# Patient Record
Sex: Male | Born: 1990 | Race: White | Hispanic: No | Marital: Single | Smoking: Current some day smoker
Health system: Southern US, Community
[De-identification: ages and names within clinical notes are randomized; demographics above are authoritative.]

---
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
## 2012-03-26 ENCOUNTER — Ambulatory Visit: Payer: Self-pay | Admitting: Family Medicine

## 2015-01-26 ENCOUNTER — Encounter (HOSPITAL_COMMUNITY): Payer: Self-pay | Admitting: Emergency Medicine

## 2015-01-26 DIAGNOSIS — W228XXA Striking against or struck by other objects, initial encounter: Secondary | ICD-10-CM | POA: Insufficient documentation

## 2015-01-26 DIAGNOSIS — Y990 Civilian activity done for income or pay: Secondary | ICD-10-CM | POA: Insufficient documentation

## 2015-01-26 DIAGNOSIS — Y9389 Activity, other specified: Secondary | ICD-10-CM | POA: Insufficient documentation

## 2015-01-26 DIAGNOSIS — S0501XA Injury of conjunctiva and corneal abrasion without foreign body, right eye, initial encounter: Secondary | ICD-10-CM | POA: Insufficient documentation

## 2015-01-26 DIAGNOSIS — Z720 Tobacco use: Secondary | ICD-10-CM | POA: Insufficient documentation

## 2015-01-26 DIAGNOSIS — Y9289 Other specified places as the place of occurrence of the external cause: Secondary | ICD-10-CM | POA: Insufficient documentation

## 2015-01-26 NOTE — ED Notes (Signed)
Pt. reported that a piece of metal hit his right eye at work last Tuesday , denies blurred vision , presents with redness/pain and teary right eye.

## 2015-01-27 ENCOUNTER — Emergency Department (HOSPITAL_COMMUNITY)
Admission: EM | Admit: 2015-01-27 | Discharge: 2015-01-27 | Disposition: A | Discharge status: Home / Self Care | Payer: Self-pay | Attending: Emergency Medicine | Admitting: Emergency Medicine

## 2015-01-27 DIAGNOSIS — S0501XA Injury of conjunctiva and corneal abrasion without foreign body, right eye, initial encounter: Secondary | ICD-10-CM

## 2015-01-27 MED ORDER — FLUORESCEIN SODIUM 1 MG OP STRP
1.0000 | ORAL_STRIP | Freq: Once | OPHTHALMIC | Status: AC
  Administered 2015-01-27: 1 via OPHTHALMIC
  Filled 2015-01-27: qty 1

## 2015-01-27 MED ORDER — TOBRAMYCIN 0.3 % OP OINT
TOPICAL_OINTMENT | Freq: Four times a day (QID) | OPHTHALMIC | Status: DC
  Filled 2015-01-27: qty 7

## 2015-01-27 MED ORDER — TETRACAINE HCL 0.5 % OP SOLN
2.0000 [drp] | Freq: Once | OPHTHALMIC | Status: AC
  Administered 2015-01-27: 2 [drp] via OPHTHALMIC
  Filled 2015-01-27: qty 2

## 2015-01-27 NOTE — ED Provider Notes (Signed)
CSN: [id number]     Arrival date & time 01/26/15  2312 History   First MD Initiated Contact with Patient 01/27/15 0056     Chief Complaint  Patient presents with  . Foreign Body in Eye     (Consider location/radiation/quality/duration/timing/severity/associated sxs/prior Treatment) Patient is a 24 y.o. male presenting with foreign body in eye. The history is provided by the patient. No language interpreter was used.  Foreign Body in Eye This is a new problem. The current episode started today. Pertinent negatives include no fever. Associated symptoms comments: He was sanding a steel piece while at work today, wearing safety glasses, and felt something enter his right eye. He has had pain, redness, increased tearing since, and feels a sense of a foreign body still in the eye.Marland Kitchen.    History reviewed. No pertinent past medical history. History reviewed. No pertinent past surgical history. No family history on file. History  Substance Use Topics  . Smoking status: Current Some Day Smoker  . Smokeless tobacco: Not on file  . Alcohol Use: Yes    Review of Systems  Constitutional: Negative for fever.  HENT: Negative for facial swelling.   Eyes: Positive for pain and redness. Negative for visual disturbance.      Allergies  Review of patient's allergies indicates no known allergies.  Home Medications   Prior to Admission medications   Not on File   BP 133/82 mmHg  Pulse 73  Temp(Src) 98.6 F (37 C) (Oral)  Resp 14  Ht 6' (1.829 m)  Wt 238 lb (107.956 kg)  BMI 32.27 kg/m2  SpO2 98% Physical Exam  Constitutional: He is oriented to person, place, and time. He appears well-developed and well-nourished.  Eyes:  Right eye injected, with excessive clear tearing. No chemosis. No hyphema. No foreign body observed. Fluorescein uptake on cornea consistent with abrasion.  Neck: Normal range of motion.  Pulmonary/Chest: Effort normal.  Musculoskeletal: Normal range of motion.   Neurological: He is alert and oriented to person, place, and time.  Skin: Skin is warm and dry.  Psychiatric: He has a normal mood and affect.    ED Course  Procedures (including critical care time) Labs Review Labs Reviewed - No data to display  Imaging Review No results found.   EKG Interpretation None      MDM   Final diagnoses:  None    1. Corneal abrasion, right  Tobrex ointment provided. Refer to Dr. Dione BoozeGroat if pain persists.    Elpidio AnisShari Lucelia Lacey, PA-C 01/27/15 0550  Loren Raceravid Yelverton, MD 01/28/15 [phone]

## 2015-01-27 NOTE — Discharge Instructions (Signed)
Corneal Abrasion °The cornea is the clear covering at the front and center of the eye. When looking at the colored portion of the eye (iris), you are looking through the cornea. This very thin tissue is made up of many layers. The surface layer is a single layer of cells (corneal epithelium) and is one of the most sensitive tissues in the body. If a scratch or injury causes the corneal epithelium to come off, it is called a corneal abrasion. If the injury extends to the tissues below the epithelium, the condition is called a corneal ulcer. °CAUSES  °· Scratches. °· Trauma. °· Foreign body in the eye. °Some people have recurrences of abrasions in the area of the original injury even after it has healed (recurrent erosion syndrome). Recurrent erosion syndrome generally improves and goes away with time. °SYMPTOMS  °· Eye pain. °· Difficulty or inability to keep the injured eye open. °· The eye becomes very sensitive to light. °· Recurrent erosions tend to happen suddenly, first thing in the morning, usually after waking up and opening the eye. °DIAGNOSIS  °Your health care provider can diagnose a corneal abrasion during an eye exam. Dye is usually placed in the eye using a drop or a small paper strip moistened by your tears. When the eye is examined with a special light, the abrasion shows up clearly because of the dye. °TREATMENT  °· Small abrasions may be treated with antibiotic drops or ointment alone. °· A pressure patch may be put over the eye. If this is done, follow your doctor's instructions for when to remove the patch. Do not drive or use machines while the eye patch is on. Judging distances is hard to do with a patch on. °If the abrasion becomes infected and spreads to the deeper tissues of the cornea, a corneal ulcer can result. This is serious because it can cause corneal scarring. Corneal scars interfere with light passing through the cornea and cause a loss of vision in the involved eye. °HOME CARE  INSTRUCTIONS °· Use medicine or ointment as directed. Only take over-the-counter or prescription medicines for pain, discomfort, or fever as directed by your health care provider. °· Do not drive or operate machinery if your eye is patched. Your ability to judge distances is impaired. °· If your health care provider has given you a follow-up appointment, it is very important to keep that appointment. Not keeping the appointment could result in a severe eye infection or permanent loss of vision. If there is any problem keeping the appointment, let your health care provider know. °SEEK MEDICAL CARE IF:  °· You have pain, light sensitivity, and a scratchy feeling in one eye or both eyes. °· Your pressure patch keeps loosening up, and you can blink your eye under the patch after treatment. °· Any kind of discharge develops from the eye after treatment or if the lids stick together in the morning. °· You have the same symptoms in the morning as you did with the original abrasion days, weeks, or months after the abrasion healed. °MAKE SURE YOU:  °· Understand these instructions. °· Will watch your condition. °· Will get help right away if you are not doing well or get worse. °Document Released: 08/16/2000 Document Revised: 08/24/2013 Document Reviewed: 04/26/2013 °[employer]® Patient Information ©2015 [employer]. This information is not intended to replace advice given to you by your health care provider. Make sure you discuss any questions you have with your health care provider. ° °

## 2015-01-27 NOTE — ED Notes (Signed)
Pt stable, ambulatory, states understanding of discharge instructions 

## 2016-08-29 ENCOUNTER — Encounter (HOSPITAL_COMMUNITY): Payer: Self-pay | Admitting: Emergency Medicine

## 2016-08-29 ENCOUNTER — Emergency Department (HOSPITAL_COMMUNITY)
Admission: EM | Admit: 2016-08-29 | Discharge: 2016-08-29 | Disposition: A | Discharge status: Home / Self Care | Payer: Self-pay | Attending: Emergency Medicine | Admitting: Emergency Medicine

## 2016-08-29 DIAGNOSIS — F17200 Nicotine dependence, unspecified, uncomplicated: Secondary | ICD-10-CM | POA: Insufficient documentation

## 2016-08-29 DIAGNOSIS — Y9389 Activity, other specified: Secondary | ICD-10-CM | POA: Insufficient documentation

## 2016-08-29 DIAGNOSIS — Y999 Unspecified external cause status: Secondary | ICD-10-CM | POA: Insufficient documentation

## 2016-08-29 DIAGNOSIS — Y929 Unspecified place or not applicable: Secondary | ICD-10-CM | POA: Insufficient documentation

## 2016-08-29 DIAGNOSIS — S0181XA Laceration without foreign body of other part of head, initial encounter: Secondary | ICD-10-CM | POA: Insufficient documentation

## 2016-08-29 DIAGNOSIS — W208XXA Other cause of strike by thrown, projected or falling object, initial encounter: Secondary | ICD-10-CM | POA: Insufficient documentation

## 2016-08-29 MED ORDER — LIDOCAINE HCL (PF) 1 % IJ SOLN
5.0000 mL | Freq: Once | INTRAMUSCULAR | Status: AC
  Administered 2016-08-29: 5 mL
  Filled 2016-08-29: qty 5

## 2016-08-29 NOTE — Discharge Instructions (Signed)
Suture removal in 7 days

## 2016-08-29 NOTE — ED Notes (Addendum)
ED Provider at bedside. 

## 2016-08-29 NOTE — ED Triage Notes (Signed)
Patient presents with skin laceration approx. 1/2 inch at forehead sustained this afternoon from a wood frame .

## 2016-08-29 NOTE — ED Provider Notes (Signed)
[facility]-EMERGENCY DEPT Provider Note   CSN: [id number] Arrival date & time: 08/29/16  1842  By signing my name below, I, Jared Sanders, attest that this documentation has been prepared under the direction and in the presence of Jared Sanders Jolynda Townley, NP-C. Electronically Signed: Orpah CobbMaurice Sanders , ED Scribe. 08/29/16. 8:13 PM.   History   Chief Complaint Chief Complaint  Patient presents with  . Laceration    HPI HPI Comments: Jared Sanders is a 25 y.o. male who presents to the Emergency Department complaining of a laceration with sudden onset x5 hours.Pt states that while putting a wood frame up, he looked away for a moment and the wood frame fell hitting him in the forehead. He reports with a laceration to the forehead. He states that he saw flashes after the impact but denies LOC, blurred vision, headache, nausea, vomiting. Pt's tetanus is UTD.   The history is provided by the patient. No language interpreter was used.  Laceration   The incident occurred 3 to 5 hours ago. Pain location: forehead. Size: 1.6cm. Injury mechanism: a wood frame. The pain is mild. The pain has been improving since onset. He reports no foreign bodies present. His tetanus status is UTD.    History reviewed. No pertinent past medical history.  There are no active problems to display for this patient.   History reviewed. No pertinent surgical history.     Home Medications    Prior to Admission medications   Not on File    Family History No family history on file.  Social History Social History  Substance Use Topics  . Smoking status: Current Some Day Smoker  . Smokeless tobacco: Never Used  . Alcohol use Yes     Allergies   Patient has no known allergies.   Review of Systems Review of Systems  Constitutional: Negative for chills and fever.  HENT: Negative for ear pain and sore throat.   Eyes: Negative for pain and visual disturbance.  Respiratory: Negative for cough and shortness of  breath.   Cardiovascular: Negative for chest pain and palpitations.  Gastrointestinal: Negative for abdominal pain and vomiting.  Genitourinary: Negative for dysuria and hematuria.  Musculoskeletal: Negative for arthralgias and back pain.  Skin: Negative for color change and rash.  Neurological: Negative for seizures and syncope.  All other systems reviewed and are negative.    Physical Exam Updated Vital Signs BP 135/96 (BP Location: Left Arm)   Pulse 86   Temp 98.7 F (37.1 C) (Oral)   Resp 16   Ht 6\' 1"  (1.854 m)   Wt 239 lb (108.4 kg)   SpO2 96%   BMI 31.53 kg/m   Physical Exam  Constitutional: He appears well-developed and well-nourished.  HENT:  Head: Normocephalic and atraumatic.  Eyes: Conjunctivae are normal.  Neck: Neck supple.  Cardiovascular: Normal rate and regular rhythm.   No murmur heard. Pulmonary/Chest: Effort normal and breath sounds normal. No respiratory distress.  Abdominal: Soft. There is no tenderness.  Musculoskeletal: He exhibits no edema.  Neurological: He is alert.  Skin: Skin is warm and dry. Laceration noted.     Psychiatric: He has a normal mood and affect.  Nursing note and vitals reviewed.    ED Treatments / Results   DIAGNOSTIC STUDIES: Oxygen Saturation is 96% on Ra, adequate by my interpretation.   COORDINATION OF CARE: 8:13 PM-Discussed next steps with pt. Pt verbalized understanding and is agreeable with the plan.    Labs (all labs ordered are listed, but  only abnormal results are displayed) Labs Reviewed - No data to display  EKG  EKG Interpretation None       Radiology No results found.  Procedures .Marland Kitchen.Laceration Repair Date/Time: 08/29/2016 9:14 PM Performed by: Katrinka BlazingSMITH, Emerick Weatherly Authorized by: Katrinka BlazingSMITH, Merida Alcantar   Consent:    Consent obtained:  Verbal   Consent given by:  Patient   Risks discussed:  Infection and poor cosmetic result   Alternatives discussed:  No treatment Anesthesia (see MAR for exact  dosages):    Anesthesia method:  Local infiltration (1% lidocaine)   Local anesthetic:  Lidocaine 1% w/o epi Laceration details:    Location:  Scalp (forehead)   Length (cm):  1.6 Repair type:    Repair type:  Simple Pre-procedure details:    Preparation:  Patient was prepped and draped in usual sterile fashion Exploration:    Contaminated: no   Treatment:    Area cleansed with:  Betadine and saline   Amount of cleaning:  Standard   Irrigation solution:  Sterile saline Skin repair:    Repair method:  Sutures   Suture size:  4-0   Suture material:  Prolene Approximation:    Approximation:  Close Post-procedure details:    Dressing:  Antibiotic ointment and non-adherent dressing   Patient tolerance of procedure:  Tolerated well, no immediate complications    (including critical care time)  Medications Ordered in ED Medications - No data to display   Initial Impression / Assessment and Plan / ED Course  I have reviewed the triage vital signs and the nursing notes.  Pertinent labs & imaging results that were available during my care of the patient were reviewed by me and considered in my medical decision making (see chart for details).  Clinical Course     Tetanus UTD. Laceration occurred < 12 hours prior to repair. Discussed laceration care with pt and answered questions. Pt to f-u for suture removal in 7-10 days and wound check sooner should there be signs of dehiscence or infection. Pt is hemodynamically stable with no complaints prior to dc.     Final Clinical Impressions(s) / ED Diagnoses   Final diagnoses:  Facial laceration, initial encounter    New Prescriptions There are no discharge medications for this patient.  I personally performed the services described in this documentation, which was scribed in my presence. The recorded information has been reviewed and is accurate.     Jared Sanders Jared Leasure, NP 08/30/16 [phone]    Gwyneth SproutWhitney Plunkett, MD 08/31/16 [phone]

## 2016-08-29 NOTE — ED Notes (Signed)
Pt departed in NAD, refusing use of a wheelchair.
